# Patient Record
Sex: Male | Born: 1990 | Race: Black or African American | Hispanic: No | Marital: Single | Smoking: Current some day smoker
Health system: Southern US, Community
[De-identification: ages and names within clinical notes are randomized; demographics above are authoritative.]

---
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
## 2012-05-21 IMAGING — CR DG WRIST COMPLETE 3+V*R*
4 series · 4 of 4 positions shown · non-contrast
Comparison: None.

CLINICAL DATA: 19-year-old male status post fall with pain.

RIGHT WRIST - COMPLETE 3+ VIEW

[x wrist pa right *]
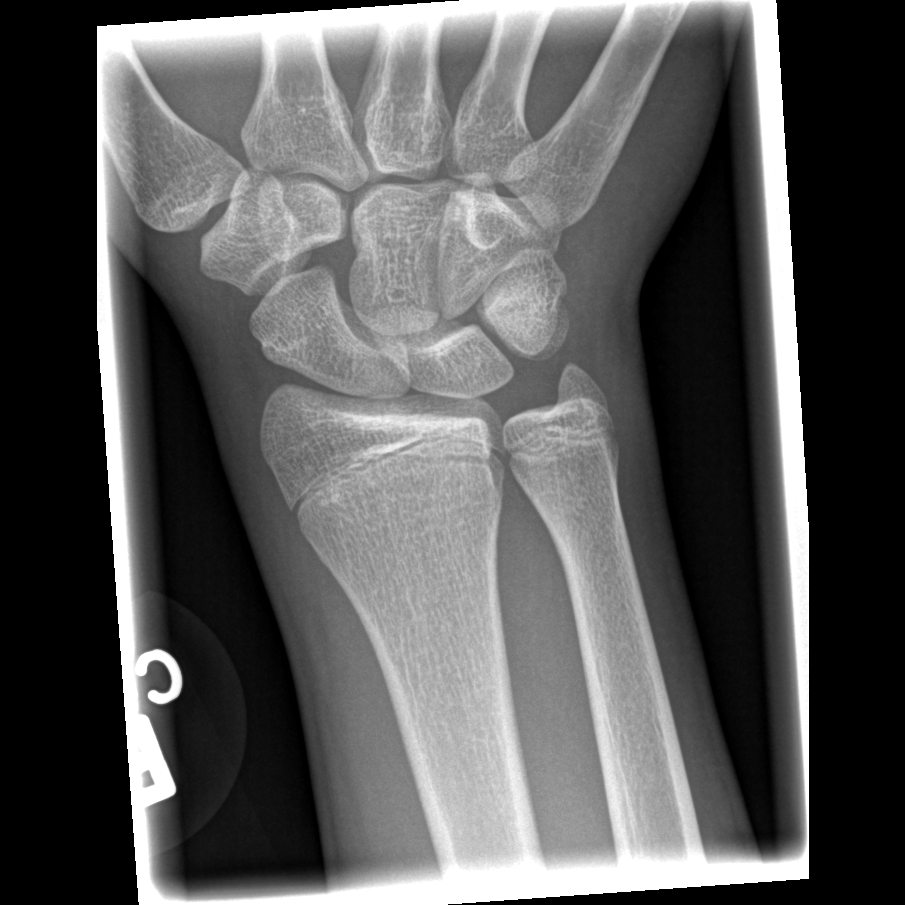

[x wrist obl right *]
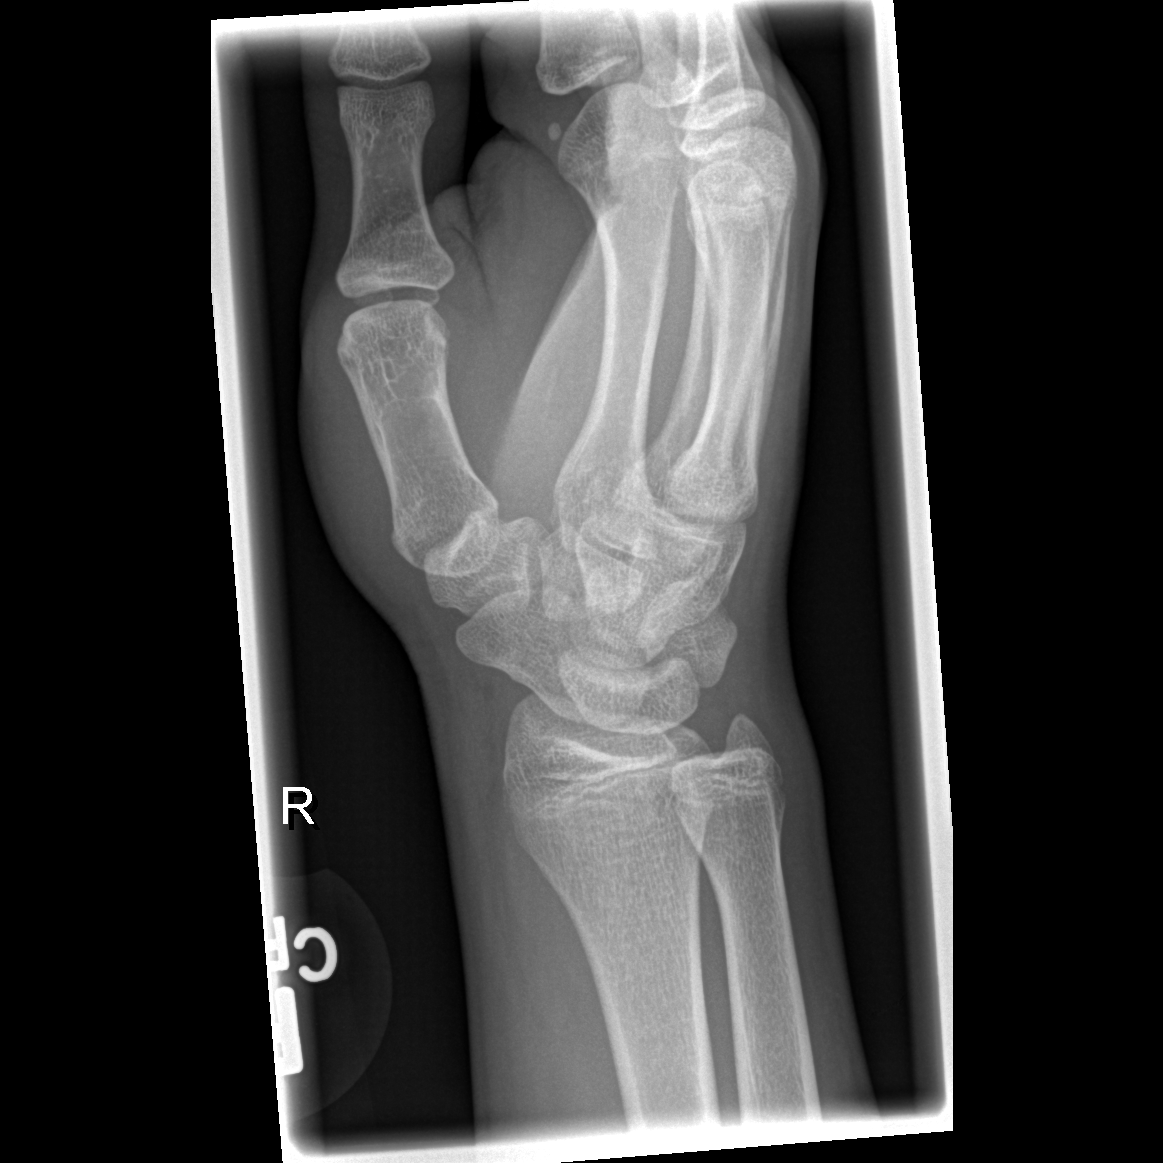

[x wrist lat right *]
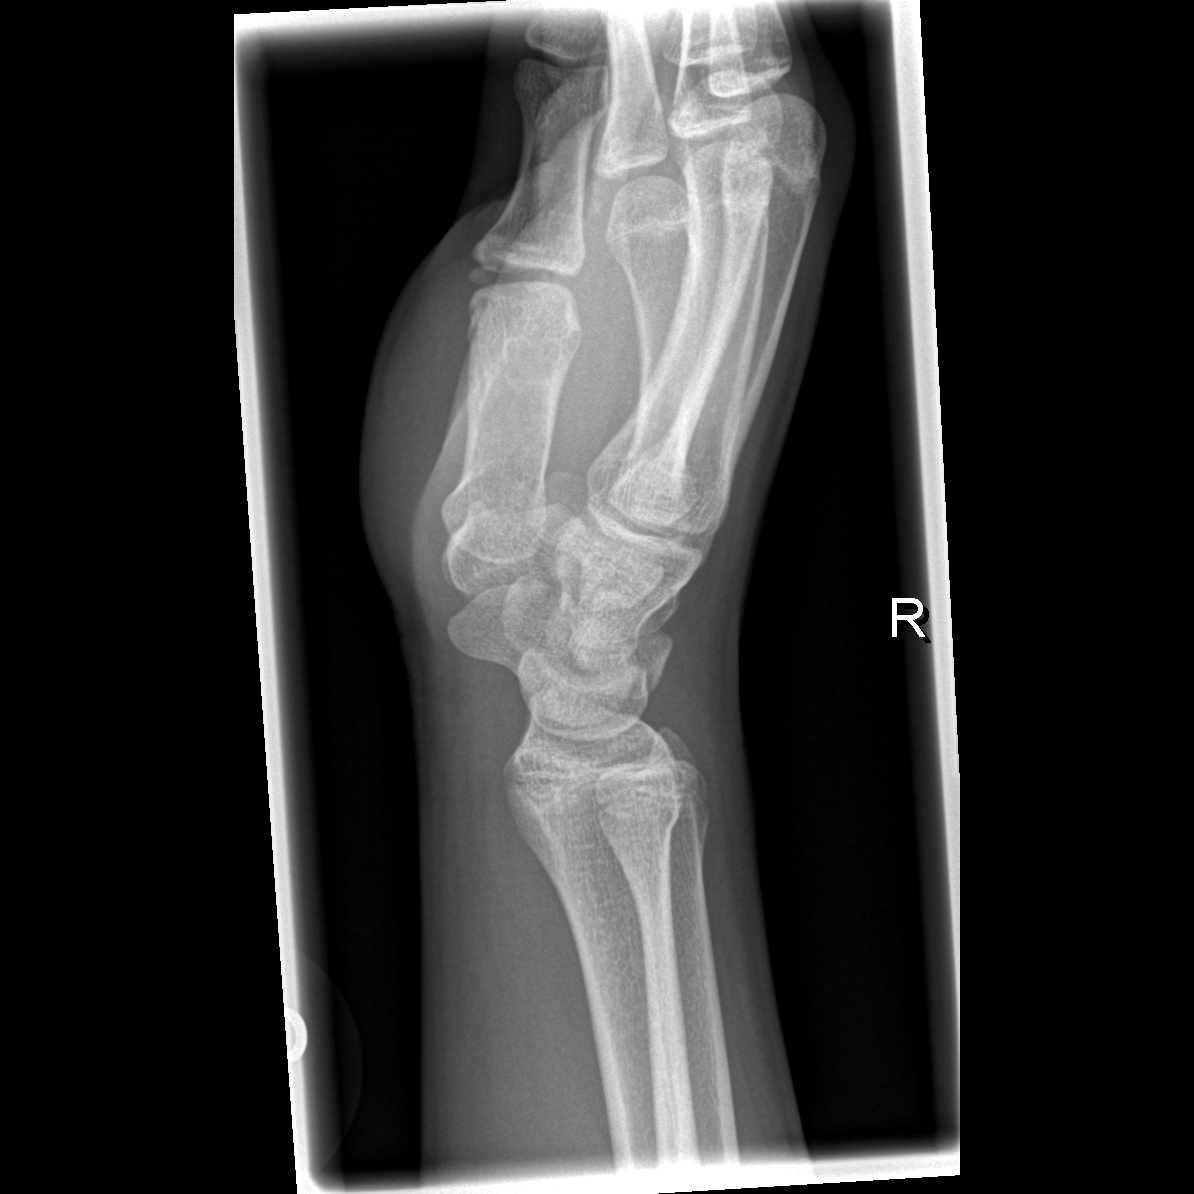

[x navicular *]
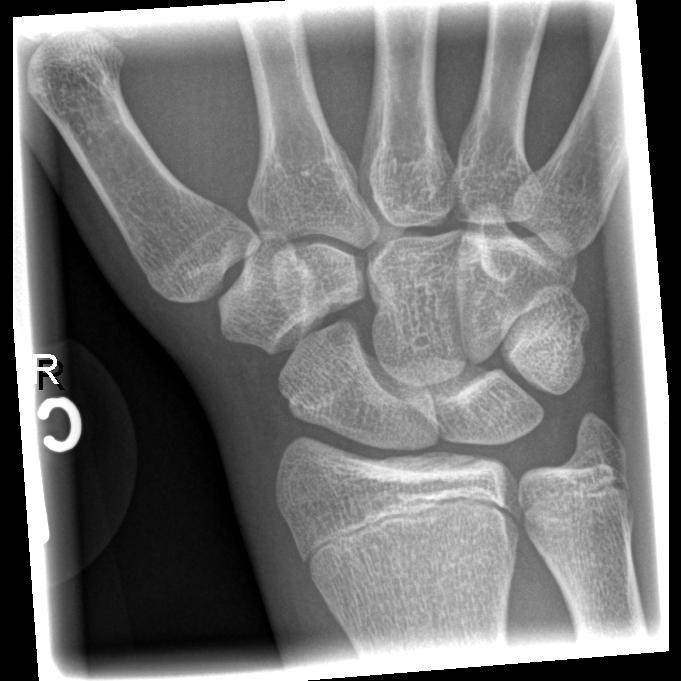

[4 of 4 positions shown; findings below may reference images not displayed]

FINDINGS: Bone mineralization is within normal limits.  The patient
is skeletally immature.  Carpal bone alignment within normal
limits.  Joint spaces within normal limits.  Distal radius and ulna
appear intact.  No acute fracture identified.
IMPRESSION: No acute fracture or dislocation identified about the right wrist.

## 2012-10-22 ENCOUNTER — Ambulatory Visit: Payer: Self-pay

## 2014-12-05 ENCOUNTER — Other Ambulatory Visit (HOSPITAL_COMMUNITY)
Admission: RE | Admit: 2014-12-05 | Discharge: 2014-12-05 | Disposition: A | Discharge status: Home / Self Care | Payer: Medicaid Other | Source: Ambulatory Visit | Attending: Emergency Medicine | Admitting: Emergency Medicine

## 2014-12-05 ENCOUNTER — Encounter (HOSPITAL_COMMUNITY): Payer: Self-pay | Admitting: Emergency Medicine

## 2014-12-05 ENCOUNTER — Emergency Department (INDEPENDENT_AMBULATORY_CARE_PROVIDER_SITE_OTHER)
Admission: EM | Admit: 2014-12-05 | Discharge: 2014-12-05 | Disposition: A | Discharge status: Home / Self Care | Payer: Medicaid Other | Source: Home / Self Care | Attending: Emergency Medicine | Admitting: Emergency Medicine

## 2014-12-05 DIAGNOSIS — Z113 Encounter for screening for infections with a predominantly sexual mode of transmission: Secondary | ICD-10-CM | POA: Diagnosis not present

## 2014-12-05 DIAGNOSIS — Z202 Contact with and (suspected) exposure to infections with a predominantly sexual mode of transmission: Secondary | ICD-10-CM

## 2014-12-05 MED ORDER — LIDOCAINE HCL (PF) 1 % IJ SOLN
INTRAMUSCULAR | Status: AC
  Filled 2014-12-05: qty 5

## 2014-12-05 MED ORDER — AZITHROMYCIN 250 MG PO TABS
1000.0000 mg | ORAL_TABLET | Freq: Once | ORAL | Status: AC
  Administered 2014-12-05: 1000 mg via ORAL

## 2014-12-05 MED ORDER — AZITHROMYCIN 250 MG PO TABS
ORAL_TABLET | ORAL | Status: AC
  Filled 2014-12-05: qty 4

## 2014-12-05 MED ORDER — CEFTRIAXONE SODIUM 250 MG IJ SOLR
250.0000 mg | Freq: Once | INTRAMUSCULAR | Status: AC
  Administered 2014-12-05: 250 mg via INTRAMUSCULAR

## 2014-12-05 MED ORDER — CEFTRIAXONE SODIUM 250 MG IJ SOLR
INTRAMUSCULAR | Status: AC
  Filled 2014-12-05: qty 250

## 2014-12-05 NOTE — Discharge Instructions (Signed)
We have collected blood and urine for STD testing. We will call you if anything comes back positive. We presumptively treated you for gonorrhea and chlamydia. Please wear a condom every time. No sex for one week. Follow-up as needed.

## 2014-12-05 NOTE — ED Provider Notes (Signed)
CSN: [id number]     Arrival date & time 12/05/14  1551 History   First MD Initiated Contact with Patient 12/05/14 1558     Chief Complaint  Patient presents with  . Exposure to STD   (Consider location/radiation/quality/duration/timing/severity/associated sxs/prior Treatment) HPI  He is a 24 year old man here for STD exposure. He states his girlfriend got her test results on Sunday and was positive for gonorrhea. She states she must have gotten it from him. He denies any symptoms. He does report one day of fever and headache several months ago. He also reports some intermittent abdominal pain and dysuria. No discharge or rashes.  History reviewed. No pertinent past medical history. History reviewed. No pertinent past surgical history. History reviewed. No pertinent family history. Social History  Substance Use Topics  . Smoking status: Current Some Day Smoker  . Smokeless tobacco: None  . Alcohol Use: Yes     Comment: occasional    Review of Systems As in history of present illness Allergies  Review of patient's allergies indicates no known allergies.  Home Medications   Prior to Admission medications   Not on File   Meds Ordered and Administered this Visit   Medications  cefTRIAXone (ROCEPHIN) injection 250 mg (not administered)  azithromycin (ZITHROMAX) tablet 1,000 mg (not administered)    There were no vitals taken for this visit. No data found.   Physical Exam  Constitutional: He is oriented to person, place, and time. He appears well-developed and well-nourished. No distress.  Cardiovascular: Normal rate.   Pulmonary/Chest: Effort normal.  Neurological: He is alert and oriented to person, place, and time.    ED Course  Procedures (including critical care time)  Labs Review Labs Reviewed  HIV ANTIBODY (ROUTINE TESTING)  RPR  URINE CYTOLOGY ANCILLARY ONLY    Imaging Review No results found.   MDM   1. STD exposure    Treatment presumptively with  Rocephin and azithromycin. Blood and urine collected. Follow-up as needed.    Charm Rings, MD 12/05/14 [phone]

## 2014-12-05 NOTE — ED Notes (Signed)
Pt's girlfriend was diagnosed with Gonorrhea last week.  Pt here to be tested for an STD

## 2014-12-06 LAB — HIV ANTIBODY (ROUTINE TESTING W REFLEX): HIV Screen 4th Generation wRfx: NONREACTIVE

## 2014-12-06 LAB — URINE CYTOLOGY ANCILLARY ONLY
Chlamydia: NEGATIVE
Neisseria Gonorrhea: NEGATIVE
Trichomonas: NEGATIVE

## 2014-12-06 LAB — RPR: RPR: NONREACTIVE

## 2014-12-07 NOTE — ED Notes (Addendum)
Final report negative for GC/chlamydia, RPR, HIV

## 2024-01-30 ENCOUNTER — Emergency Department (HOSPITAL_BASED_OUTPATIENT_CLINIC_OR_DEPARTMENT_OTHER)
Admission: EM | Admit: 2024-01-30 | Discharge: 2024-01-30 | Disposition: A | Discharge status: Home / Self Care | Payer: Self-pay | Attending: Emergency Medicine | Admitting: Emergency Medicine

## 2024-01-30 ENCOUNTER — Encounter (HOSPITAL_BASED_OUTPATIENT_CLINIC_OR_DEPARTMENT_OTHER): Payer: Self-pay

## 2024-01-30 ENCOUNTER — Other Ambulatory Visit: Payer: Self-pay

## 2024-01-30 DIAGNOSIS — K0889 Other specified disorders of teeth and supporting structures: Secondary | ICD-10-CM | POA: Insufficient documentation

## 2024-01-30 MED ORDER — AMOXICILLIN-POT CLAVULANATE 875-125 MG PO TABS
1.0000 | ORAL_TABLET | Freq: Once | ORAL | Status: AC
Start: 2024-01-30 — End: 2024-01-30
  Administered 2024-01-30: 1 via ORAL
  Filled 2024-01-30: qty 1

## 2024-01-30 MED ORDER — AMOXICILLIN-POT CLAVULANATE 875-125 MG PO TABS: 1.0000 | ORAL_TABLET | Freq: Two times a day (BID) | ORAL | 0 refills | Status: DC

## 2024-01-30 MED ORDER — NAPROXEN 500 MG PO TABS: 500.0000 mg | ORAL_TABLET | Freq: Two times a day (BID) | ORAL | 0 refills | Status: DC

## 2024-01-30 MED ORDER — NAPROXEN 250 MG PO TABS
500.0000 mg | ORAL_TABLET | Freq: Once | ORAL | Status: AC
  Administered 2024-01-30: 500 mg via ORAL
  Filled 2024-01-30: qty 2

## 2024-01-30 NOTE — ED Provider Notes (Signed)
   [place] [facility]  Provider Note  CSN: [id number] Arrival date & time: 01/30/24 0050  History Chief Complaint  Patient presents with   Dental Pain    Scott Rich is a 33 y.o. male with no PMH reports 3 months of wisdom teeth pain, worse in R lower the last few days. No fever.    Home Medications Prior to Admission medications   Medication Sig Start Date End Date Taking? Authorizing Provider  amoxicillin -clavulanate (AUGMENTIN ) 875-125 MG tablet Take 1 tablet by mouth every 12 (twelve) hours. 01/30/24  Yes Roselyn Carlin NOVAK, MD  naproxen  (NAPROSYN ) 500 MG tablet Take 1 tablet (500 mg total) by mouth 2 (two) times daily. 01/30/24  Yes Roselyn Carlin NOVAK, MD     Allergies    Patient has no known allergies.   Review of Systems   Review of Systems Please see HPI for pertinent positives and negatives  Physical Exam BP (!) 124/91   Pulse 79   Temp 98.4 F (36.9 C) (Oral)   Resp 14   Ht 5' 4 (1.626 m)   Wt 62.6 kg   SpO2 100%   BMI 23.69 kg/m   Physical Exam Vitals and nursing note reviewed.  HENT:     Head: Normocephalic.     Nose: Nose normal.     Mouth/Throat:     Comments: Dental caries in R and L lower 3rd molars, no signs of deep tissue infection Eyes:     Extraocular Movements: Extraocular movements intact.  Pulmonary:     Effort: Pulmonary effort is normal.  Musculoskeletal:        General: Normal range of motion.     Cervical back: Neck supple.  Skin:    Findings: No rash (on exposed skin).  Neurological:     Mental Status: He is alert and oriented to person, place, and time.  Psychiatric:        Mood and Affect: Mood normal.     ED Results / Procedures / Treatments   EKG None  Procedures Procedures  Medications Ordered in the ED Medications  naproxen  (NAPROSYN ) tablet 500 mg (has no administration in time range)  amoxicillin -clavulanate (AUGMENTIN ) 875-125 MG per tablet 1 tablet (has no  administration in time range)    Initial Impression and Plan  Patient here with uncomplicated dental pain, recommend outpatient dental follow up for definitive care. RTED for other concerns.   ED Course       MDM Rules/Calculators/A&P Medical Decision Making Problems Addressed: Toothache: acute illness or injury  Risk Prescription drug management.     Final Clinical Impression(s) / ED Diagnoses Final diagnoses:  Toothache    Rx / DC Orders ED Discharge Orders          Ordered    amoxicillin -clavulanate (AUGMENTIN ) 875-125 MG tablet  Every 12 hours        01/30/24 0131    naproxen  (NAPROSYN ) 500 MG tablet  2 times daily        01/30/24 0131             Roselyn Carlin NOVAK, MD 01/30/24 [phone]

## 2024-01-30 NOTE — ED Triage Notes (Signed)
 Pt reports R lower tooth pain x3 months.

## 2024-03-26 ENCOUNTER — Encounter (HOSPITAL_BASED_OUTPATIENT_CLINIC_OR_DEPARTMENT_OTHER): Payer: Self-pay | Admitting: Emergency Medicine

## 2024-03-26 ENCOUNTER — Emergency Department (HOSPITAL_BASED_OUTPATIENT_CLINIC_OR_DEPARTMENT_OTHER)
Admission: EM | Admit: 2024-03-26 | Discharge: 2024-03-26 | Disposition: A | Discharge status: Home / Self Care | Payer: Self-pay | Attending: Emergency Medicine | Admitting: Emergency Medicine

## 2024-03-26 DIAGNOSIS — K0889 Other specified disorders of teeth and supporting structures: Secondary | ICD-10-CM | POA: Insufficient documentation

## 2024-03-26 MED ORDER — AMOXICILLIN-POT CLAVULANATE 875-125 MG PO TABS: 1.0000 | ORAL_TABLET | Freq: Two times a day (BID) | ORAL | 0 refills | Status: AC

## 2024-03-26 MED ORDER — NAPROXEN 500 MG PO TABS: 500.0000 mg | ORAL_TABLET | Freq: Two times a day (BID) | ORAL | 0 refills | Status: AC

## 2024-03-26 NOTE — ED Provider Notes (Signed)
 " [place] [facility] Provider Note  CSN: [id number] Arrival date & time: 03/26/24 0444  Chief Complaint(s) Dental Pain  HPI Scott Rich is a 34 y.o. male presenting with dental pain.  He was seen in the emergency department previously for dental pain and reports his symptoms are the same.  He reports he was prescribed naproxen  and Augmentin  and he took these and it did help with the symptoms.  He had information about follow-up on the paper but he lost that.  He reports that the pain returned and he wants the information for follow-up so he came back to the ER.  No nausea or vomiting, facial swelling, trouble swallowing.   Past Medical History History reviewed. No pertinent past medical history. There are no active problems to display for this patient.  Home Medication(s) Prior to Admission medications  Medication Sig Start Date End Date Taking? Authorizing Provider  amoxicillin -clavulanate (AUGMENTIN ) 875-125 MG tablet Take 1 tablet by mouth every 12 (twelve) hours. 03/26/24   Francesca Elsie CROME, MD  naproxen  (NAPROSYN ) 500 MG tablet Take 1 tablet (500 mg total) by mouth 2 (two) times daily. 03/26/24   Francesca Elsie CROME, MD                                                                                                                                    Past Surgical History History reviewed. No pertinent surgical history. Family History History reviewed. No pertinent family history.  Social History Social History[1] Allergies Patient has no known allergies.  Review of Systems Review of Systems  All other systems reviewed and are negative.   Physical Exam Vital Signs  I have reviewed the triage vital signs BP 128/85   Pulse 75   Temp 97.9 F (36.6 C) (Oral)   Resp 17   Ht 5' 4 (1.626 m)   Wt 62.1 kg   SpO2 100%   BMI 23.52 kg/m  Physical Exam Vitals and nursing note reviewed.  Constitutional:      General: He is not in  acute distress.    Appearance: Normal appearance.  HENT:     Head: Normocephalic and atraumatic.     Mouth/Throat:     Mouth: Mucous membranes are moist.     Comments: Somewhat poor dentition, patient localizes pain to the right upper and lower wisdom tooth area.  No dental abscess, floor of mouth soft.  No facial swelling Eyes:     Conjunctiva/sclera: Conjunctivae normal.  Cardiovascular:     Rate and Rhythm: Normal rate.  Pulmonary:     Effort: Pulmonary effort is normal. No respiratory distress.  Abdominal:     General: Abdomen is flat.  Skin:    General: Skin is warm and dry.     Capillary Refill: Capillary refill takes less than 2 seconds.  Neurological:     General: No focal deficit present.  Mental Status: He is alert. Mental status is at baseline.  Psychiatric:        Mood and Affect: Mood normal.        Behavior: Behavior normal.     ED Results and Treatments Labs (all labs ordered are listed, but only abnormal results are displayed) Labs Reviewed - No data to display                                                                                                                        Radiology No results found.  Pertinent labs & imaging results that were available during my care of the patient were reviewed by me and considered in my medical decision making (see MDM for details).  Medications Ordered in ED Medications - No data to display                                                                                                                                   Procedures Procedures  (including critical care time)  Medical Decision Making / ED Course   MDM:  34 year old presenting with dental pain.  Patient is overall well-appearing, no evidence of obvious dental abscess or other dangerous process.  Outpatient follow-up with dentistry for consideration of wisdom tooth removal and further dental care.  Will prescribe pain medication and antibiotics  as he reported reports this worked with for him last time.  Will discharge patient to home. All questions answered. Patient comfortable with plan of discharge. Return precautions discussed with patient and specified on the after visit summary.       Additional history obtained:  -External records from outside source obtained and reviewed including: Chart review including previous notes, labs, imaging, consultation notes including prior ER visit    Medicines ordered and prescription drug management: Meds ordered this encounter  Medications   amoxicillin -clavulanate (AUGMENTIN ) 875-125 MG tablet    Sig: Take 1 tablet by mouth every 12 (twelve) hours.    Dispense:  14 tablet    Refill:  0   naproxen  (NAPROSYN ) 500 MG tablet    Sig: Take 1 tablet (500 mg total) by mouth 2 (two) times daily.    Dispense:  30 tablet    Refill:  0    -I have reviewed the patients home medicines and have made adjustments as needed  Co morbidities that complicate the patient evaluation History reviewed. No pertinent past medical  history.    Dispostion: Disposition decision including need for hospitalization was considered, and patient discharged from emergency department.    Final Clinical Impression(s) / ED Diagnoses Final diagnoses:  Tooth pain     This chart was dictated using voice recognition software.  Despite best efforts to proofread,  errors can occur which can change the documentation meaning.     [1]  Social History Tobacco Use   Smoking status: Some Days  Substance Use Topics   Alcohol use: Yes    Comment: occasional   Drug use: No     Francesca Elsie CROME, MD 03/26/24 [phone]  "

## 2024-03-26 NOTE — ED Triage Notes (Signed)
 Pt states left upper and lower wisdom teeth issues. Since last month. Seen for same bust lost paperwork for follow up.
